# Patient Record
Sex: Male | Born: 1950 | Race: Black or African American | Hispanic: No | Marital: Married | Smoking: Never smoker
Health system: Southern US, Community
[De-identification: ages and names within clinical notes are randomized; demographics above are authoritative.]

## PROBLEM LIST (undated history)

## (undated) HISTORY — PX: HEMORROIDECTOMY: SUR656

---

## 2006-11-24 ENCOUNTER — Ambulatory Visit: Payer: Self-pay | Admitting: Gastroenterology

## 2006-11-28 ENCOUNTER — Ambulatory Visit: Payer: Self-pay | Admitting: Gastroenterology

## 2009-05-12 ENCOUNTER — Ambulatory Visit: Payer: Self-pay | Admitting: Family Medicine

## 2010-06-04 ENCOUNTER — Ambulatory Visit: Payer: Self-pay | Admitting: Internal Medicine

## 2019-01-20 ENCOUNTER — Encounter: Payer: Self-pay | Admitting: Emergency Medicine

## 2019-01-20 ENCOUNTER — Other Ambulatory Visit: Payer: Self-pay

## 2019-01-20 ENCOUNTER — Ambulatory Visit
Admission: EM | Admit: 2019-01-20 | Discharge: 2019-01-20 | Disposition: A | Discharge status: Home / Self Care | Payer: No Typology Code available for payment source | Attending: Urgent Care | Admitting: Urgent Care

## 2019-01-20 ENCOUNTER — Ambulatory Visit
Admit: 2019-01-20 | Discharge: 2019-01-20 | Disposition: A | Discharge status: Home / Self Care | Payer: No Typology Code available for payment source | Attending: Urgent Care | Admitting: Urgent Care

## 2019-01-20 DIAGNOSIS — Y92410 Unspecified street and highway as the place of occurrence of the external cause: Secondary | ICD-10-CM | POA: Diagnosis not present

## 2019-01-20 DIAGNOSIS — M542 Cervicalgia: Secondary | ICD-10-CM

## 2019-01-20 DIAGNOSIS — G44319 Acute post-traumatic headache, not intractable: Secondary | ICD-10-CM
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
## 2019-01-20 NOTE — Discharge Instructions (Addendum)
It was very nice seeing you today in clinic. Thank you for entrusting me with your care.   Your CT scan was negative. Apply moist heat to your neck to help with the muscle pain. You advised that you did not like taking pain medications and did not wish for anything to be sent. May use Tylenol and Ibuprofen as needed for pain. Call us back if you pain becomes severe.   Make arrangements to follow up with your regular doctor in 1 week for re-evaluation if not improving. If your symptoms/condition worsens, please seek follow up care either here or in the ER. Please remember, our [place] providers are "right here with you" when you need [place].   Again, it was my pleasure to take care of you today. Thank you for choosing our clinic. I hope that you start to feel better quickly.   Honor Loh, MSN, APRN, FNP-C, CEN Advanced Practice Provider [facility]
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
## 2019-01-26 ENCOUNTER — Encounter: Payer: Self-pay | Admitting: Emergency Medicine

## 2019-01-26 ENCOUNTER — Other Ambulatory Visit: Payer: Self-pay

## 2019-01-26 ENCOUNTER — Ambulatory Visit
Admission: EM | Admit: 2019-01-26 | Discharge: 2019-01-26 | Disposition: A | Discharge status: Home / Self Care | Payer: Medicare Other | Attending: Urgent Care | Admitting: Urgent Care

## 2019-01-26 DIAGNOSIS — M436 Torticollis: Secondary | ICD-10-CM

## 2019-01-26 DIAGNOSIS — G44209 Tension-type headache, unspecified, not intractable: Secondary | ICD-10-CM

## 2019-01-26 MED ORDER — IBUPROFEN 600 MG PO TABS: 600.0000 mg | ORAL_TABLET | Freq: Four times a day (QID) | ORAL | 0 refills | Status: DC | PRN

## 2019-01-26 MED ORDER — CYCLOBENZAPRINE HCL 5 MG PO TABS: 5.0000 mg | ORAL_TABLET | Freq: Two times a day (BID) | ORAL | 0 refills | Status: AC | PRN
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
## 2019-01-26 NOTE — Discharge Instructions (Signed)
It was very nice seeing you today in clinic. Thank you for entrusting me with your care.   Continue moist heat application. Add the antiinflammatory and muscle relaxing medications. Remember the muscle relaxers may may you sleepy, so be careful with using them. Start off with 1 to see how it effects you.   Make arrangements to follow up with your regular doctor in 1 week for re-evaluation if not improving. If your symptoms/condition worsens, please seek follow up care either here or in the ER. Please remember, our [place] providers are "right here with you" when you need [place].   Again, it was my pleasure to take care of you today. Thank you for choosing our clinic. I hope that you start to feel better quickly.   Honor Loh, MSN, APRN, FNP-C, CEN Advanced Practice Provider [facility]

## 2019-02-06 ENCOUNTER — Encounter: Payer: Self-pay | Admitting: Emergency Medicine

## 2019-02-06 ENCOUNTER — Other Ambulatory Visit: Payer: Self-pay

## 2019-02-06 ENCOUNTER — Ambulatory Visit: Payer: Medicare Other

## 2019-02-06 ENCOUNTER — Ambulatory Visit
Admission: EM | Admit: 2019-02-06 | Discharge: 2019-02-06 | Disposition: A | Discharge status: Home / Self Care | Payer: Medicare Other | Attending: Family Medicine | Admitting: Family Medicine

## 2019-02-06 DIAGNOSIS — M7072 Other bursitis of hip, left hip: Secondary | ICD-10-CM | POA: Diagnosis not present

## 2019-02-06 DIAGNOSIS — M1612 Unilateral primary osteoarthritis, left hip: Secondary | ICD-10-CM | POA: Diagnosis present

## 2019-02-06 MED ORDER — MELOXICAM 15 MG PO TABS: 15.0000 mg | ORAL_TABLET | Freq: Every day | ORAL | 0 refills | Status: AC | PRN

## 2019-02-06 NOTE — Discharge Instructions (Addendum)
Take medication as prescribed. Rest. Drink plenty of fluids.  ° °Follow up with orthopedic as needed for continued pain.  ° °Follow up with your primary care physician this week as needed. Return to Urgent care for new or worsening concerns.  ° °
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
## 2019-02-06 NOTE — ED Triage Notes (Signed)
Patient in office today c/o left hip pain.  When walking and movement x3d    OTC:[facility]
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
## 2021-04-03 ENCOUNTER — Encounter: Payer: Self-pay | Admitting: Family Medicine
